# Patient Record
Sex: Male | Born: 1985 | Race: White | Hispanic: No | Marital: Single | Smoking: Current some day smoker
Health system: Southern US, Community
[De-identification: ages and names within clinical notes are randomized; demographics above are authoritative.]

## PROBLEM LIST (undated history)

## (undated) ENCOUNTER — Ambulatory Visit (HOSPITAL_COMMUNITY): Admission: EM | Payer: Self-pay | Source: Home / Self Care

## (undated) DIAGNOSIS — F1110 Opioid abuse, uncomplicated: Secondary | ICD-10-CM

## (undated) HISTORY — PX: OTHER SURGICAL HISTORY: SHX169

---
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
## 2005-12-30 ENCOUNTER — Emergency Department (HOSPITAL_COMMUNITY): Admission: EM | Admit: 2005-12-30 | Discharge: 2005-12-30 | Payer: Self-pay | Admitting: Emergency Medicine

## 2006-01-05 ENCOUNTER — Encounter: Admission: RE | Admit: 2006-01-05 | Discharge: 2006-01-05 | Payer: Self-pay | Admitting: Neurosurgery

## 2006-02-02 ENCOUNTER — Encounter: Admission: RE | Admit: 2006-02-02 | Discharge: 2006-02-02 | Payer: Self-pay | Admitting: Neurosurgery

## 2006-03-29 ENCOUNTER — Encounter: Admission: RE | Admit: 2006-03-29 | Discharge: 2006-03-29 | Payer: Self-pay | Admitting: Neurosurgery

## 2012-01-10 ENCOUNTER — Encounter (HOSPITAL_COMMUNITY): Payer: Self-pay | Admitting: *Deleted

## 2012-01-10 ENCOUNTER — Emergency Department (HOSPITAL_COMMUNITY)
Admission: EM | Admit: 2012-01-10 | Discharge: 2012-01-10 | Disposition: A | Discharge status: Home / Self Care | Payer: Self-pay | Attending: Emergency Medicine | Admitting: Emergency Medicine

## 2012-01-10 DIAGNOSIS — F1110 Opioid abuse, uncomplicated: Secondary | ICD-10-CM | POA: Insufficient documentation

## 2012-01-10 DIAGNOSIS — R110 Nausea: Secondary | ICD-10-CM | POA: Insufficient documentation

## 2012-01-10 DIAGNOSIS — F17200 Nicotine dependence, unspecified, uncomplicated: Secondary | ICD-10-CM | POA: Insufficient documentation

## 2012-01-10 HISTORY — DX: Opioid abuse, uncomplicated: F11.10

## 2012-01-10 LAB — COMPREHENSIVE METABOLIC PANEL
ALT: 19 U/L (ref 0–53)
AST: 13 U/L (ref 0–37)
CO2: 25 mEq/L (ref 19–32)
Calcium: 9.6 mg/dL (ref 8.4–10.5)
Chloride: 105 mEq/L (ref 96–112)
GFR calc Af Amer: >90 mL/min (ref >90)
GFR calc non Af Amer: >90 mL/min (ref >90)
Total Protein: 7.6 g/dL (ref 6.0–8.3)

## 2012-01-10 LAB — CBC
Hemoglobin: 14.4 g/dL (ref 13.0–17.0)
MCH: 30.8 pg (ref 26.0–34.0)
MCV: 87.4 fL (ref 78.0–100.0)
Platelets: 357 10*3/uL (ref 150–400)
WBC: 9.6 10*3/uL (ref 4.0–10.5)

## 2012-01-10 LAB — RAPID URINE DRUG SCREEN, HOSP PERFORMED
Amphetamines: NOT DETECTED
Barbiturates: NOT DETECTED
Cocaine: NOT DETECTED
Tetrahydrocannabinol: POSITIVE — AB

## 2012-01-10 LAB — DIFFERENTIAL
Basophils Absolute: 0 10*3/uL (ref 0.0–0.1)
Eosinophils Relative: 0 % (ref 0–5)
Lymphs Abs: 1.1 10*3/uL (ref 0.7–4.0)
Monocytes Absolute: 0.5 10*3/uL (ref 0.1–1.0)

## 2012-01-10 LAB — URINALYSIS, ROUTINE W REFLEX MICROSCOPIC
Hgb urine dipstick: NEGATIVE
Specific Gravity, Urine: 1.025 (ref 1.005–1.030)

## 2012-01-10 LAB — ETHANOL: Alcohol, Ethyl (B): <11 mg/dL (ref 0–11)

## 2012-01-10 MED ORDER — LORAZEPAM 1 MG PO TABS
1.0000 mg | ORAL_TABLET | Freq: Once | ORAL | Status: AC
  Administered 2012-01-10: 1 mg via ORAL
  Filled 2012-01-10: qty 1

## 2012-01-10 MED ORDER — ONDANSETRON 8 MG PO TBDP: 8.0000 mg | ORAL_TABLET | Freq: Three times a day (TID) | ORAL | Status: AC | PRN

## 2012-01-10 MED ORDER — ONDANSETRON 4 MG PO TBDP
4.0000 mg | ORAL_TABLET | Freq: Once | ORAL | Status: AC
  Administered 2012-01-10: 4 mg via ORAL
  Filled 2012-01-10: qty 1

## 2012-01-10 MED ORDER — LORAZEPAM 1 MG PO TABS: 1.0000 mg | ORAL_TABLET | Freq: Three times a day (TID) | ORAL | Status: AC | PRN

## 2012-01-10 NOTE — ED Notes (Signed)
Act team is in with pt at this time

## 2012-01-10 NOTE — ED Notes (Signed)
Pt is stable at d/c 

## 2012-01-10 NOTE — ED Notes (Signed)
Pt states he wants detox of pain medication.  Pt states he has tried other drugs in the past. Pt states he is having withdrawal. Pt states his last pain medication was on Sunday.

## 2012-01-10 NOTE — BH Assessment (Signed)
Assessment Note   Isaac Williams is an 26 y.o. male. Pt states he wants detox of pain medication. Pt specifies that he uses opiates including Heroin, Roxicodone, and Oxycodone. Pt reports Heroin use started "a few months ago". He reports using 3-4x's in the past month. His last use was 1 month ago. Patient also uses Oxycodone occasionally. His most used drug is Roxicodone in which he uses daily. He reports using 120 mg's or more daily x3 yrs. His last use was 2 days ago and he used approx. 90 mg's.  Pt states he has tried other drugs in the past. No alcohol use noted. Pt visible having severe withdrawals. He is unable to remain still during the assessment and pacing the room. Patient repeatedly stating, "Oh I feel so bad". Patient denies history of any type of mental health and/or substance abuse treatment. However, admits to seeing the psychiatrist Dr. Massie Maroon. Sts he has been prescribed Buspar, however; never got it filled. Pt mentions that his mother is prescribed the same medication and takes her medication "since it's the exact same". Patient denies SI, HI, and AVH's.   Writer discussed treatment options with patient. Writer strongly recommended patient to participate in in-pt treatment. Patient declined due to financial reasons. Explained to patient that their were programs available including in-pt that would provide services for those without funding or insurance. Patient then stated, "I just don't want in-pt treatment". Patient began asking if he could get out-pt referrals including referrals for Suboxene treatment. Writer provided paper referrals to patient ([facility], [facility], NA, various out-pt programs, independent threrapist/psychiatrist, etc.)  *Pt offered/refused in-pt detox. Out referrals given.   Axis I: Opioid Dependence Axis II: Deferred Axis III:  Past Medical History  Diagnosis Date  . Opiate abuse, continuous    Axis IV: economic problems, occupational problems, other  psychosocial or environmental problems, problems related to social environment and problems with access to health care services Axis V: 41-50 serious symptoms  Past Medical History:  Past Medical History  Diagnosis Date  . Opiate abuse, continuous     History reviewed. No pertinent past surgical history.  Family History: History reviewed. No pertinent family history.  Social History:  reports that he has been smoking.  He does not have any smokeless tobacco history on file. He reports that he uses illicit drugs (Heroin and Oxycodone). He reports that he does not drink alcohol.  Additional Social History:  Alcohol / Drug Use Pain Medications: Pt denies taking pain meds prescribed. Sts he buys opiates off the street. Prescriptions: Buspar (Sts he is not compliant with taking medications. Sts he never had filled. Takes his mothers medications b/c she has "the same thing". Over the Counter: Pt deneies History of alcohol / drug use?: Yes Longest period of sobriety (when/how long): 3-4 weeks Negative Consequences of Use: Financial;Personal relationships Withdrawal Symptoms: Agitation;Fever / Chills;Irritability;Nausea / Vomiting;Sweats;Tremors;Tingling (sts, "I feel weird". pt reports restless leg as well.) Substance #1 Name of Substance 1: Opiates (Heroin) 1 - Age of First Use: 25 1 - Amount (size/oz): pt doesn't know the exact amt. Sts it was a small amt. 1 - Frequency: 3x's in the past few months 1 - Duration: on-going for the past 3-4 months 1 - Last Use / Amount: 1 month ago sts "It has been a while" Substance #2 Name of Substance 2:  Roxy's 2 - Age of First Use: approx. 3 yrs ago 2 - Amount (size/oz): over a 120mg 's per day 2 - Frequency: daily  2 - Duration: on-going for the past 3 yrs every day 2 - Last Use / Amount: 2 days ago/appro.90mg 's Substance #3 Name of Substance 3: Oxycodone 3 - Age of First Use: approx. 2 yrs 3 - Amount (size/oz): "not much" 3 - Frequency: seldom  use 3 - Duration: on-going 3 - Last Use / Amount: yrs ago Allergies: No Known Allergies  Home Medications:  Medications Prior to Admission  Medication Dose Route Frequency Provider Last Rate Last Dose  . LORazepam (ATIVAN) tablet 1 mg  1 mg Oral Once [employer] Pickering, MD   1 mg at 01/10/12 1347  . ondansetron (ZOFRAN-ODT) disintegrating tablet 4 mg  4 mg Oral Once [employer] Pickering, MD   4 mg at 01/10/12 1347   No current outpatient prescriptions on file as of 01/10/2012.    OB/GYN Status:  No LMP for male patient.  General Assessment Data Location of Assessment: [facility] ACT Assessment:  (No) Living Arrangements:  (lives with mother) Can pt return to current living arrangement?: Yes Admission Status: Voluntary Is patient capable of signing voluntary admission?: Yes Transfer from: Home Referral Source: Self/Family/Friend  Education Status Is patient currently in school?: No  Risk to self Suicidal Ideation: No Suicidal Intent: No Is patient at risk for suicide?: No Suicidal Plan?: No Access to Means: No What has been your use of drugs/alcohol within the last 12 months?:  (Opiates-Heroin and Roxy's) Previous Attempts/Gestures: No How many times?:  (0) Other Self Harm Risks:  (none reported) Triggers for Past Attempts:  (n/a) Intentional Self Injurious Behavior: None Family Suicide History: No Recent stressful life event(s):  (pt denies) Persecutory voices/beliefs?: No Depression: No Depression Symptoms:  (pt denies symptoms) Substance abuse history and/or treatment for substance abuse?: Yes Suicide prevention information given to non-admitted patients: Not applicable  Risk to Others Homicidal Ideation: No Thoughts of Harm to Others: No Current Homicidal Intent: No Current Homicidal Plan: No Access to Homicidal Means: No Describe Access to Homicidal Means:  (n/a) Identified Victim:  (n/a) History of harm to others?: No Assessment of Violence: None  Noted Violent Behavior Description:  (n/a) Does patient have access to weapons?: No Criminal Charges Pending?: No Does patient have a court date: No  Psychosis Hallucinations: None noted Delusions: None noted  Mental Status Report Appear/Hygiene: Disheveled Eye Contact: Poor Motor Activity: Agitation;Restlessness;Tremors (pt sts that he is actively withdrawing) Speech: Logical/coherent Level of Consciousness: Irritable;Alert Mood: Anxious;Irritable;Preoccupied Affect: Anxious;Irritable;Preoccupied Anxiety Level: None Thought Processes: Coherent Judgement: Unimpaired Orientation: Person;Place;Time;Situation Obsessive Compulsive Thoughts/Behaviors: None  Cognitive Functioning Concentration: Decreased Memory: Recent Intact;Remote Intact IQ: Average Insight: Poor Impulse Control: Poor Appetite: Poor Weight Loss:  (unknown) Weight Gain:  (n/a) Sleep: Decreased Total Hours of Sleep:  (4-5 hrs per night')  Prior Inpatient Therapy Prior Inpatient Therapy: No Prior Therapy Dates:  (no prior therapy) Prior Therapy Facilty/Provider(s):  (n/a) Reason for Treatment:  (n/a)  Prior Outpatient Therapy Prior Outpatient Therapy: Yes Prior Therapy Dates:  (current) Prior Therapy Facilty/Provider(s):  [facility]) Reason for Treatment:  (depression, anxiety)  ADL Screening (condition at time of admission) Patient's cognitive ability adequate to safely complete daily activities?: Yes Patient able to express need for assistance with ADLs?: Yes Independently performs ADLs?: Yes Weakness of Legs: None Weakness of Arms/Hands: None  Home Assistive Devices/Equipment Home Assistive Devices/Equipment: None  Therapy Consults (therapy consults require a physician order) PT Evaluation Needed: No OT Evalulation Needed: No SLP Evaluation Needed: No Abuse/Neglect Assessment (Assessment to be complete while patient is alone) Physical  Abuse: Denies Verbal Abuse: Denies Sexual Abuse:  Denies Exploitation of patient/patient's resources: Denies Self-Neglect: Denies Values / Beliefs Cultural Requests During Hospitalization: None Spiritual Requests During Hospitalization: None Consults Spiritual Care Consult Needed: No Social Work Consult Needed: No   Nutrition Screen Diet: Regular Unintentional weight loss greater than 10lbs within the last month: No Dysphagia: No Home Tube Feeding or Total Parenteral Nutrition (TPN): No Patient appears severely malnourished: No Pregnant or Lactating: No Dietitian Consult Needed: No  Additional Information 1:1 In Past 12 Months?: No CIRT Risk: No Elopement Risk: No Does patient have medical clearance?: Yes     Disposition:  Disposition Disposition of Patient: Treatment offered and refused;Referred to;Outpatient treatment (in-pt detox from opiates) Type of outpatient treatment:  (various programs including: [facility], [facility], Support Groups,) Type of treatment offered and refused: In-patient Patient referred to: [facility];Outpatient clinic referral ([facility], [place], support groups, mobile c)  On Site Evaluation by:   Reviewed with Physician:     Melynda Ripple [facility] 01/10/2012 2:36 PM

## 2012-01-10 NOTE — ED Provider Notes (Signed)
History     CSN: [id number]  Arrival date & time 01/10/12  1226   First MD Initiated Contact with Patient 01/10/12 1325      Chief Complaint  Patient presents with  . Medical Clearance    (Consider location/radiation/quality/duration/timing/severity/associated sxs/prior treatment) The history is provided by the patient.   patient states she's requesting detox off of opiates. He states he takes 120 mg of Roxicet today. He snorts them. He also does some heroin occasionally. He will inject this into his left antecubital. He states it is mostly Roxicet. He is other medications the past. In the last 2 years his longest period of sobriety spent 2 weeks. He states he's not been in treatment before. He thinks he would like outpatient treatment now. He's had some nauseousness without vomiting. He states the worst part of the withdrawal for him as his restless legs. He last used 2 days ago. No fevers. No chest pain. No abdominal pain. He states it hurts all over. No suicidal or homicidal ideations  Past Medical History  Diagnosis Date  . Opiate abuse, continuous     History reviewed. No pertinent past surgical history.  History reviewed. No pertinent family history.  History  Substance Use Topics  . Smoking status: Current Some Day Smoker  . Smokeless tobacco: Not on file  . Alcohol Use: No      Review of Systems  Constitutional: Negative for activity change and appetite change.  HENT: Negative for neck stiffness.   Eyes: Negative for pain.  Respiratory: Negative for chest tightness and shortness of breath.   Cardiovascular: Negative for chest pain and leg swelling.  Gastrointestinal: Positive for nausea. Negative for vomiting, abdominal pain and diarrhea.  Genitourinary: Negative for flank pain.  Musculoskeletal: Positive for arthralgias. Negative for back pain.  Skin: Negative for rash.  Neurological: Negative for weakness, numbness and headaches.  Psychiatric/Behavioral:  Negative for behavioral problems.       Anxiety    Allergies  Review of patient's allergies indicates no known allergies.  Home Medications   Current Outpatient Rx  Name Route Sig Dispense Refill  . BUSPIRONE HCL 10 MG PO TABS Oral Take 20 mg by mouth 3 (three) times daily.     . CYCLOBENZAPRINE HCL 10 MG PO TABS Oral Take 10 mg by mouth 3 (three) times daily.    Marland Kitchen LORAZEPAM 1 MG PO TABS Oral Take 1 tablet (1 mg total) by mouth 3 (three) times daily as needed for anxiety. 15 tablet 0  . ONDANSETRON 8 MG PO TBDP Oral Take 1 tablet (8 mg total) by mouth every 8 (eight) hours as needed for nausea. 20 tablet 0    BP 119/77  Pulse 86  Temp(Src) 97.9 F (36.6 C) (Oral)  Resp 18  Ht 5\' 11"  (1.803 m)  Wt 165 lb (74.844 kg)  BMI 23.01 kg/m2  SpO2 94%  Physical Exam  Nursing note and vitals reviewed. Constitutional: He is oriented to person, place, and time. He appears well-developed and well-nourished.  HENT:  Head: Normocephalic and atraumatic.  Eyes: EOM are normal. Pupils are equal, round, and reactive to light.  Neck: Normal range of motion. Neck supple.  Cardiovascular: Normal rate, regular rhythm and normal heart sounds.   No murmur heard. Pulmonary/Chest: Effort normal and breath sounds normal.  Abdominal: Soft. Bowel sounds are normal. He exhibits no distension and no mass. There is no tenderness. There is no rebound and no guarding.  Musculoskeletal: Normal range of motion. He exhibits  no edema.  Neurological: He is alert and oriented to person, place, and time. No cranial nerve deficit.  Skin: Skin is warm and dry.       No abscess to left antecubital area.  Psychiatric: He has a normal mood and affect.    ED Course  Procedures (including critical care time)  Labs Reviewed  DIFFERENTIAL - Abnormal; Notable for the following:    Neutrophils Relative 82 (*)    Neutro Abs 7.9 (*)    All other components within normal limits  COMPREHENSIVE METABOLIC PANEL - Abnormal;  Notable for the following:    Glucose, Bld 100 (*)    All other components within normal limits  URINE RAPID DRUG SCREEN (HOSP PERFORMED) - Abnormal; Notable for the following:    Tetrahydrocannabinol POSITIVE (*)    All other components within normal limits  URINALYSIS, ROUTINE W REFLEX MICROSCOPIC - Abnormal; Notable for the following:    Ketones, ur TRACE (*)    All other components within normal limits  ETHANOL  CBC   No results found.   1. Opioid abuse       MDM  Patient is requesting detox off of opiates. Last use yesterday. Is requesting that he not get either Suboxone or methadone. He does not want inpatient treatment. He later told the social worker that he wanted Suboxone. He'll be discharged home with outpatient followup.        Juliet Rude. Rubin Payor, MD 01/10/12 [id number]

## 2012-01-10 NOTE — Discharge Instructions (Signed)
Opiate Dependence  The above names are all different names used for opiates. Opiates are any medication made from the poppy plant. It is a medication which produces a calming, sleepy effect. Because achieving this effect requires more and more of this drug to get the same result, opiates become addictive. A family history of addiction or an addictive personality increases the risk.  When drug use is interfering with normal living activities, it has become abuse. This includes problems with family, friends, and your job. Psychological dependence has developed when your mind tells you that the drug is needed. This emotional dependence is the craving for the "high" that some drugs cause. Emotional addicts always want this high instead of the way they are feeling when not using the drug. This is difficult to overcome.  This is usually followed by physical dependence that has developed when continuing increases of drug are required to get the same feeling or "high". This is known as addiction or chemical dependency.   SIGNS OF CHEMICAL DEPENDENCY  · Friends and family tell you there is a problem.   · Fighting when using drugs.   · Mood swings and insomnia.   · Forgetfulness.   · Not remembering what you do while using (blackouts).   · Feeling sick from using drugs but you continue using.   · Lie about use or amounts of drugs (chemicals) used.   · Need chemicals to get you going.   · Suffer in work performance or school because of drug use.   · Need drugs to relate to people or feel comfortable in social situations.   · Use drugs to forget problems.   · Difficulty with attention.   · Neglecting obligations.   If you answered "yes" to any or some of the above signs of chemical dependency, you may have a problem. The longer the use of drugs continues, the greater the problems will become. Do not experiment with drugs.   SIGNS AND SYMPTOMS OF PHYSICAL DEPENDENCE   Sudden stopping of the narcotic is uncomfortable when  tolerance has developed. Physical problems will develop. This is called withdrawal.   How bad the withdrawal is varies from person to person. Some of the smaller problems are:  · Tremors in the hands or shakes and jitters.   · A fast heart rate and rapid breathing.   · An increase in temperature.   · Anxiety and panic attacks with bad dreams.   · Muscle aches and pains.   You may have more serious problems. These can include:  · Feeling sick to your stomach or throwing up.   · Dehydration develops if you cannot keep fluids down.   · Tremors and chills or fever with sweating and anxiety.   · Hallucinations and cravings.   · Body aching with restlessness and insomnia.   · Seizures or convulsions.   These problems can last for months. These uncomfortable feeling can cause you to use drugs again just to feel better.  OTHER HEALTH RISKS OF NARCOTICS USE INCLUDE:  · The increased possibility of getting AIDS, hepatitis, other infectious or sexually transmitted diseases.   · Unplanned pregnancy and having a baby born addicted to narcotics. You then put your baby through painful withdrawal symptoms including: shaking, jerking, and crying in pain. Many babies die. Other babies have lifelong disabilities and learning problems.   TREATMENT   Effective treatment and management of narcotic addiction requires a multi-faceted, team approach that includes:  · Medications to   minimize the symptoms of narcotic withdrawal.   · Medications to reduce the need for continued narcotic use.   · Medical management of unrelated medical problems.   · Pain management.   · Social services.   · Psychological treatment.   · Behavioral therapies.   Stopping your dependence is hard but may save your life. If you continue using drugs, the only possible outcome is loss of self respect and esteem, violence, and eventually prison or death. To stop abuse, you must first realize you have a problem. You control your behavior. Once you realize this, commit to  quitting. Addiction is a disease. You need medical help to get well. Your caregiver can counsel you or refer you for counseling. The best way to do this is to seek out an organization for help. These include Alcoholics Anonymous, Narcotics Anonymous, or the National Council on Alcoholism and Drug Dependence.  HOW TO STAY CLEAN WHEN YOU HAVE QUIT USING  · Develop healthy activities.   · Form friendships with those who do not use drugs.   · Stay away from all drugs. Alcohol will lessen your ability to say no.   · Have ready excuses available about why you cannot use. If that is difficult, stay away from people who knew you used.   HOME CARE INSTRUCTIONS   · Both prescription medicines and over-the-counter medicines are used as part of the treatment. It is critical to follow the recommendations of your caregiver at home. Any additional over-the-counter medications or changes in the recommended treatment plan should be discussed with your caregiver first.   · It is important to keep fluids down. Juices, soda, Gatorade, or a mixture will help prevent dehydration.   · Be prepared for the emotional swings of quitting.   · Call your local emergency services if seizures (convulsions) occur or if you are unable to keep liquids down.   · Keep a written record of medications you take and times given.   Overcoming addictions takes years. Over time you will have a lessening of the craving for narcotics. Talk to your caregiver or a member of your support group if you need more help.   Addiction cannot be cured but it can be stopped. Treatment centers are listed in the yellow pages under: Cocaine, Narcotics, and Alcoholics Anonymous. Most hospitals and clinics can refer you to a specialized care center.  Document Released: 09/04/2007 Document Revised: 07/20/2011 Document Reviewed: 09/04/2007  [employer]® Patient Information ©2012 [employer].

## 2012-08-22 ENCOUNTER — Emergency Department (HOSPITAL_COMMUNITY)
Admission: EM | Admit: 2012-08-22 | Discharge: 2012-08-22 | Payer: Self-pay | Attending: Emergency Medicine | Admitting: Emergency Medicine

## 2012-08-22 ENCOUNTER — Encounter (HOSPITAL_COMMUNITY): Payer: Self-pay | Admitting: *Deleted

## 2012-08-22 DIAGNOSIS — F1120 Opioid dependence, uncomplicated: Secondary | ICD-10-CM | POA: Insufficient documentation

## 2012-08-22 DIAGNOSIS — F17200 Nicotine dependence, unspecified, uncomplicated: Secondary | ICD-10-CM | POA: Insufficient documentation

## 2012-08-22 LAB — BASIC METABOLIC PANEL
CO2: 27 mEq/L (ref 19–32)
Calcium: 9.8 mg/dL (ref 8.4–10.5)
Chloride: 102 mEq/L (ref 96–112)
Creatinine, Ser: 0.93 mg/dL (ref 0.50–1.35)
GFR calc Af Amer: >90 mL/min (ref >90)
GFR calc non Af Amer: >90 mL/min (ref >90)
Glucose, Bld: 90 mg/dL (ref 70–99)
Sodium: 139 mEq/L (ref 135–145)

## 2012-08-22 LAB — CBC WITH DIFFERENTIAL/PLATELET
Basophils Relative: 0 % (ref 0–1)
Eosinophils Relative: 1 % (ref 0–5)
HCT: 43.8 % (ref 39.0–52.0)
Hemoglobin: 15.2 g/dL (ref 13.0–17.0)
Lymphs Abs: 1 10*3/uL (ref 0.7–4.0)
MCV: 89.9 fL (ref 78.0–100.0)
Monocytes Absolute: 0.4 10*3/uL (ref 0.1–1.0)
Neutro Abs: 5.5 10*3/uL (ref 1.7–7.7)
RBC: 4.87 MIL/uL (ref 4.22–5.81)

## 2012-08-22 NOTE — ED Notes (Signed)
Report given to Gabe RN

## 2012-08-22 NOTE — BH Assessment (Signed)
Assessment Note   Isaac Williams is an 25 y.o. male that presents with his mother, requesting Heroin Detox.  Pt reports intermittent bouts of sobriety, but relapsed within the last two months and is now using at least 1 gram every other day.  Pt is currently experiencing sweats, chills, decreased sleep and appetite.  Pt reports being in the [facility] and seeing Dr. Evelene Croon up until two months ago.  Once he left the program, he began using again.  Pt denies any psychiatric history or any history of depression, SI, HI or any psychosis.  Pt is calm and cooperative and has support from his family.  Pt agrees to inpatient treatment with the option for residential treatment afterwards.    Axis I: Opioid Dependence Axis II: Deferred Axis III:  Past Medical History  Diagnosis Date  . Opiate abuse, continuous    Axis IV: occupational problems, problems related to social environment and problems with primary support group Axis V: 31-40 impairment in reality testing  Past Medical History:  Past Medical History  Diagnosis Date  . Opiate abuse, continuous     Past Surgical History  Procedure Date  . Compression fracture lower back     Family History: No family history on file.  Social History:  reports that he has been smoking.  He does not have any smokeless tobacco history on file. He reports that he uses illicit drugs (Heroin, Oxycodone, and IV). He reports that he does not drink alcohol.  Additional Social History:  Alcohol / Drug Use Pain Medications: None prescribed Prescriptions: None prescribed Over the Counter: No History of alcohol / drug use?: Yes Substance #1 Name of Substance 1: Heroin 1 - Age of First Use: 17 1 - Amount (size/oz): up to 1 gram 1 - Frequency: every other day 1 - Duration: two months  1 - Last Use / Amount: 2 days ago  CIWA: CIWA-Ar BP: 144/78 mmHg Pulse Rate: 91  COWS: Clinical Opiate Withdrawal Scale (COWS) Resting Pulse Rate: Pulse Rate 80 or  below Sweating: Subjective report of chills or flushing Restlessness: Able to sit still Pupil Size: Pupils pinned or normal size for room light Bone or Joint Aches: Not present Runny Nose or Tearing: Not present GI Upset: Stomach cramps Tremor: No tremor Yawning: No yawning Anxiety or Irritability: Patient reports increasing irritability or anxiousness Gooseflesh Skin: Piloerection of skin can be felt or hairs standing up on arms COWS Total Score: 6   Allergies:  Allergies  Allergen Reactions  . Capsaicin Other (See Comments)    Burning     Home Medications:  (Not in a hospital admission)  OB/GYN Status:  No LMP for male patient.  General Assessment Data Location of Assessment: [facility] ED Living Arrangements: Non-relatives/Friends Can pt return to current living arrangement?: Yes Admission Status: Voluntary Is patient capable of signing voluntary admission?: Yes Transfer from: Acute Hospital Referral Source: Self/Family/Friend  Education Status Is patient currently in school?: No Highest grade of school patient has completed: 2 years of college  Risk to self Suicidal Ideation: No Suicidal Intent: No Is patient at risk for suicide?: No Suicidal Plan?: No Access to Means: No What has been your use of drugs/alcohol within the last 12 months?: IV Heroin use Previous Attempts/Gestures: No How many times?: 0  Other Self Harm Risks: reckless and impulsive Triggers for Past Attempts: Unpredictable Intentional Self Injurious Behavior: Damaging Comment - Self Injurious Behavior: ongoing SA  Family Suicide History: No Recent stressful life event(s): Job  Loss;Turmoil (Comment) Persecutory voices/beliefs?: No Depression: No Substance abuse history and/or treatment for substance abuse?: Yes Suicide prevention information given to non-admitted patients: Not applicable  Risk to Others Homicidal Ideation: No Thoughts of Harm to Others: No Current Homicidal Intent: No Current  Homicidal Plan: No Access to Homicidal Means: No Identified Victim: none per pt History of harm to others?: No Assessment of Violence: None Noted Violent Behavior Description: calm and cooperative Does patient have access to weapons?: No Criminal Charges Pending?: No Does patient have a court date: No  Psychosis Hallucinations: None noted Delusions: None noted  Mental Status Report Appear/Hygiene: Other (Comment) Eye Contact: Good Motor Activity: Unremarkable Speech: Logical/coherent Level of Consciousness: Alert Mood: Helpless;Guilty Affect: Anxious Anxiety Level: Moderate Thought Processes: Relevant Judgement: Impaired Orientation: Person;Place;Time;Situation Obsessive Compulsive Thoughts/Behaviors: Moderate  Cognitive Functioning Concentration: Decreased Memory: Recent Intact;Remote Intact IQ: Average Insight: Fair Impulse Control: Poor Appetite: Fair Weight Loss: 10  Weight Gain: 0  Sleep: Decreased Total Hours of Sleep:  (2-3 at most) Vegetative Symptoms: None  ADLScreening [facility]) Patient's cognitive ability adequate to safely complete daily activities?: Yes Patient able to express need for assistance with ADLs?: Yes Independently performs ADLs?: Yes (appropriate for developmental age)  Abuse/Neglect [facility]) Physical Abuse: Denies Verbal Abuse: Denies Sexual Abuse: Denies  Prior Inpatient Therapy Prior Inpatient Therapy: No Prior Therapy Dates: n/a Prior Therapy Facilty/Provider(s): n/a Reason for Treatment: n/a  Prior Outpatient Therapy Prior Outpatient Therapy: Yes Prior Therapy Dates: 2012-2013 Prior Therapy Facilty/Provider(s): [facility] Reason for Treatment: SA  ADL Screening (condition at time of admission) Patient's cognitive ability adequate to safely complete daily activities?: Yes Patient able to express need for assistance with ADLs?: Yes Independently performs ADLs?: Yes (appropriate for developmental  age)       Abuse/Neglect Assessment (Assessment to be complete while patient is alone) Physical Abuse: Denies Verbal Abuse: Denies Sexual Abuse: Denies Exploitation of patient/patient's resources: Denies Self-Neglect: Denies Values / Beliefs Cultural Requests During Hospitalization: None Spiritual Requests During Hospitalization: None   Advance Directives (For Healthcare) Advance Directive: Patient does not have advance directive    Additional Information 1:1 In Past 12 Months?: No CIRT Risk: No Elopement Risk: No Does patient have medical clearance?: Yes     Disposition: Referred to [facility].  Screening packet completed. Disposition Disposition of Patient: Referred to Patient referred to: [facility]  On Site Evaluation by:   Reviewed with Physician:     Angelica Ran 08/22/2012 4:14 PM

## 2012-08-22 NOTE — ED Notes (Signed)
Patient placed in paper scrubs. Clothing inventoried and secured.  Silver colored necklace and cell phone given to patient's mother to take home.

## 2012-08-22 NOTE — ED Provider Notes (Signed)
History    This chart was scribed for Nelia Shi, MD, MD by Smitty Pluck. The patient was seen in room TR05C and the patient's care was started at 3:06PM.   CSN: [id number]  Arrival date & time 08/22/12  1409      Chief Complaint  Patient presents with  . Medical Clearance     The history is provided by the patient. No language interpreter was used.   Isaac Williams is a 26 y.o. male who presents to the Emergency Department for detox from IV heroin usage. Pt reports that he last used 2 days ago. Pt denies SI, HI and any other pain.    Past Medical History  Diagnosis Date  . Opiate abuse, continuous     Past Surgical History  Procedure Date  . Compression fracture lower back     No family history on file.  History  Substance Use Topics  . Smoking status: Current Some Day Smoker  . Smokeless tobacco: Not on file  . Alcohol Use: No      Review of Systems  All other systems reviewed and are negative.    Allergies  Capsaicin  Home Medications  No current outpatient prescriptions on file.  BP 144/78  Pulse 91  Temp 98.2 F (36.8 C) (Oral)  Resp 18  SpO2 100%  Physical Exam  Nursing note and vitals reviewed. Constitutional: He is oriented to person, place, and time. He appears well-developed. No distress.  HENT:  Head: Normocephalic and atraumatic.  Eyes: Pupils are equal, round, and reactive to light.  Neck: Normal range of motion.  Cardiovascular: Normal rate and intact distal pulses.   Pulmonary/Chest: No respiratory distress.  Abdominal: Normal appearance. He exhibits no distension.  Musculoskeletal: Normal range of motion.  Neurological: He is alert and oriented to person, place, and time. No cranial nerve deficit.  Skin: Skin is warm and dry. No rash noted.  Psychiatric: He has a normal mood and affect. His behavior is normal.    ED Course  Procedures (including critical care time) DIAGNOSTIC STUDIES: Oxygen Saturation is 100% on room  air, normal by my interpretation.    COORDINATION OF CARE:     Labs Reviewed  CBC WITH DIFFERENTIAL  BASIC METABOLIC PANEL  URINE RAPID DRUG SCREEN (HOSP PERFORMED)  SALICYLATE LEVEL  ACETAMINOPHEN LEVEL  ETHANOL   No results found.   1. Heroin addiction       MDM  We'll move to applied to see and consult the act team for possible placement    I personally performed the services described in this documentation, which was scribed in my presence. The recorded information has been reviewed and considered.       Nelia Shi, MD 08/22/12 2219

## 2012-08-22 NOTE — ED Notes (Signed)
Pt is here to detox from IV heroin.  No SI/HI.  Last use was 2 days ago

## 2021-03-17 ENCOUNTER — Encounter (HOSPITAL_COMMUNITY): Payer: Self-pay | Admitting: Emergency Medicine

## 2021-03-17 ENCOUNTER — Emergency Department (HOSPITAL_COMMUNITY): Payer: Worker's Compensation

## 2021-03-17 ENCOUNTER — Emergency Department (HOSPITAL_COMMUNITY)
Admission: EM | Admit: 2021-03-17 | Discharge: 2021-03-17 | Disposition: A | Discharge status: Home / Self Care | Payer: Worker's Compensation | Attending: Emergency Medicine | Admitting: Emergency Medicine

## 2021-03-17 DIAGNOSIS — F17200 Nicotine dependence, unspecified, uncomplicated: Secondary | ICD-10-CM | POA: Insufficient documentation

## 2021-03-17 DIAGNOSIS — M545 Low back pain, unspecified: Secondary | ICD-10-CM | POA: Diagnosis not present

## 2021-03-17 LAB — URINALYSIS, ROUTINE W REFLEX MICROSCOPIC
Glucose, UA: NEGATIVE mg/dL
Hgb urine dipstick: NEGATIVE
Ketones, ur: 5 mg/dL — AB
Leukocytes,Ua: NEGATIVE
Nitrite: NEGATIVE
Protein, ur: NEGATIVE mg/dL
Specific Gravity, Urine: 1.021 (ref 1.005–1.030)
pH: 5 (ref 5.0–8.0)

## 2021-03-17 MED ORDER — DIAZEPAM 5 MG PO TABS
5.0000 mg | ORAL_TABLET | Freq: Once | ORAL | Status: AC
  Administered 2021-03-17: 5 mg via ORAL
  Filled 2021-03-17: qty 1

## 2021-03-17 MED ORDER — PREDNISONE 20 MG PO TABS: 40.0000 mg | ORAL_TABLET | Freq: Every day | ORAL | 0 refills | Status: DC

## 2021-03-17 MED ORDER — KETOROLAC TROMETHAMINE 15 MG/ML IJ SOLN: 15.0000 mg | Freq: Once | INTRAMUSCULAR | Status: DC

## 2021-03-17 MED ORDER — CYCLOBENZAPRINE HCL 10 MG PO TABS: 10.0000 mg | ORAL_TABLET | Freq: Three times a day (TID) | ORAL | 0 refills | Status: AC | PRN

## 2021-03-17 MED ORDER — OXYCODONE HCL 5 MG PO TABS
5.0000 mg | ORAL_TABLET | Freq: Once | ORAL | Status: AC
  Administered 2021-03-17: 5 mg via ORAL
  Filled 2021-03-17: qty 1

## 2021-03-17 MED ORDER — CYCLOBENZAPRINE HCL 10 MG PO TABS: 10.0000 mg | ORAL_TABLET | Freq: Three times a day (TID) | ORAL | 0 refills | Status: DC | PRN

## 2021-03-17 MED ORDER — PREDNISONE 20 MG PO TABS: 40.0000 mg | ORAL_TABLET | Freq: Every day | ORAL | 0 refills | Status: AC

## 2021-03-17 MED ORDER — ACETAMINOPHEN 500 MG PO TABS
1000.0000 mg | ORAL_TABLET | Freq: Once | ORAL | Status: AC
  Administered 2021-03-17: 1000 mg via ORAL
  Filled 2021-03-17: qty 2

## 2021-03-17 MED ORDER — KETOROLAC TROMETHAMINE 15 MG/ML IJ SOLN
15.0000 mg | Freq: Once | INTRAMUSCULAR | Status: AC
  Administered 2021-03-17: 15 mg via INTRAMUSCULAR
  Filled 2021-03-17: qty 1

## 2021-03-17 NOTE — ED Notes (Signed)
Patient verbalizes understanding of discharge instructions. Opportunity for questioning and answers were provided. Armband removed by staff, pt discharged from ED and ambulated to lobby to return home with family.  

## 2021-03-17 NOTE — ED Triage Notes (Signed)
Pt here from home with c/o left side falnk pain , pt was doing some work yesterday when the pain started
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
## 2021-03-17 NOTE — Discharge Instructions (Signed)
You were provided a copy of your x-ray report.  I have prescribed a short course of muscle relaxers to help with your pain, please take 1 tablet up to 3 times a day for the next 5 days for muscle spasms.  Please be aware this medication can cause drowsiness, do not drink alcohol or drive while taking this medication.  You were also prescribed steroids on today's visit, please be aware this can cause insomnia, appetite changes, flushness.

## 2021-03-17 NOTE — ED Triage Notes (Signed)
Emergency Medicine Provider Triage Evaluation Note  Isaac Williams , a 35 y.o. male  was evaluated in triage.  Pt complains of left-sided back pain.  Thoracic and lumbar region.  Does not extend into the legs.  History of IVDU 2013 however not recently.  No bowel or bladder incontinence, saddle.  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  Pain worse with movement.  No chest pain, shortness of breath, lower extremity edema, no urinary complaints Review of Systems  Positive: Back pain, thoracic pain Negative: Chest pain, shortness of breath, abdominal pain, urinary  Physical Exam  BP (!) 142/98 (BP Location: Right Arm)   Pulse 72   Temp 98.1 F (36.7 C) (Oral)   Resp 18   SpO2 100%  Gen:   Awake, no distress   HEENT:  Atraumatic  Resp:  Normal effort  Cardiac:  Normal rate  Abd:   Nondistended, nontender  MSK:   Moves extremities without difficulty.  Tenderness to left thoracic, left lumbar paraspinal muscles with palpable spasm Neuro:  Speech clear, intact sensation  Medical Decision Making  Medically screening exam initiated at 7:56 AM.  Appropriate orders placed.  IANMICHAEL AMESCUA was informed that the remainder of the evaluation will be completed by another provider, this initial triage assessment does not replace that evaluation, and the importance of remaining in the ED until their evaluation is complete.  Clinical Impression  Left-sided back pain   Stable   Jayanna Kroeger A, PA-C 03/17/21 0802

## 2021-04-27 IMAGING — DX DG LUMBAR SPINE COMPLETE 4+V
5 series · 5 of 5 positions shown · non-contrast
Comparison: 12/30/2005

CLINICAL DATA: Left-sided back and flank pain beginning yesterday.

EXAM:
LUMBAR SPINE - COMPLETE 4+ VIEW

[l-spine ap]
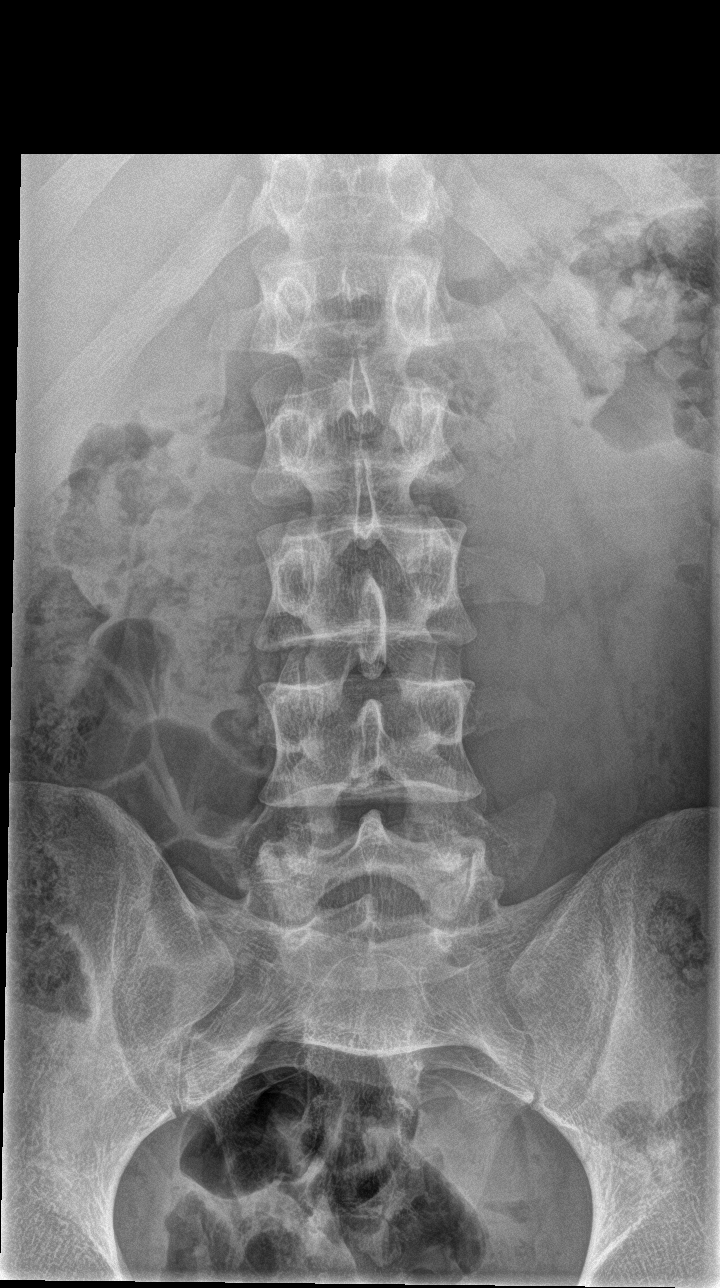

[l-spine obl (1 of 2)]
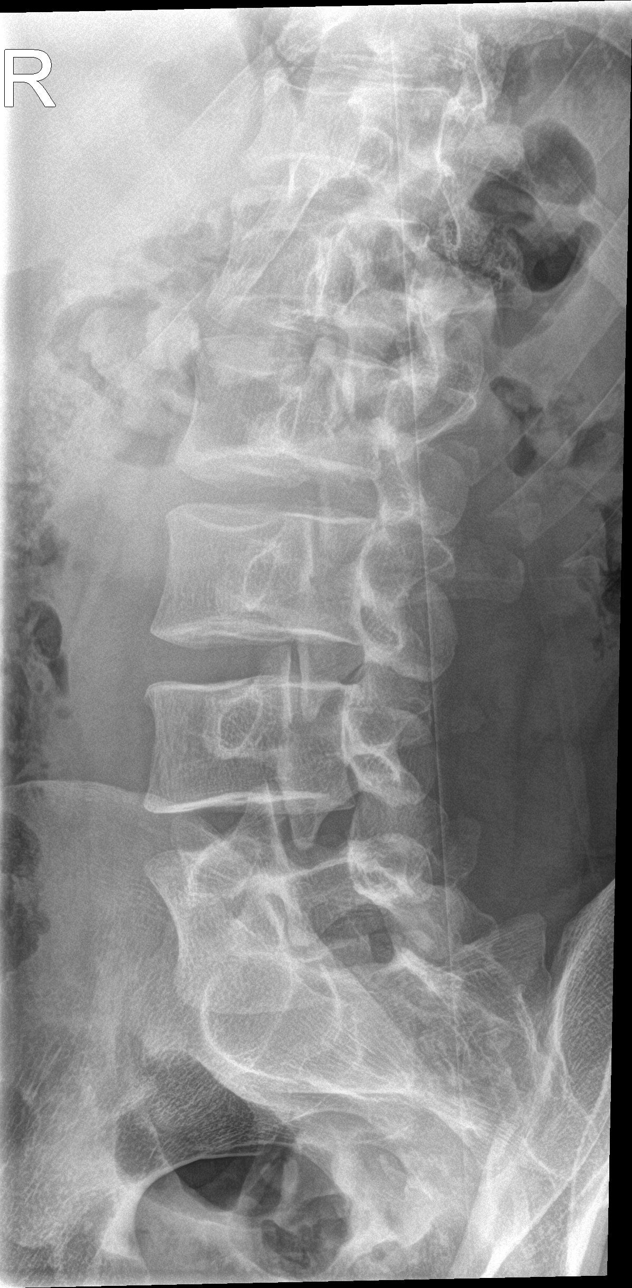

[l-spine obl (2 of 2)]
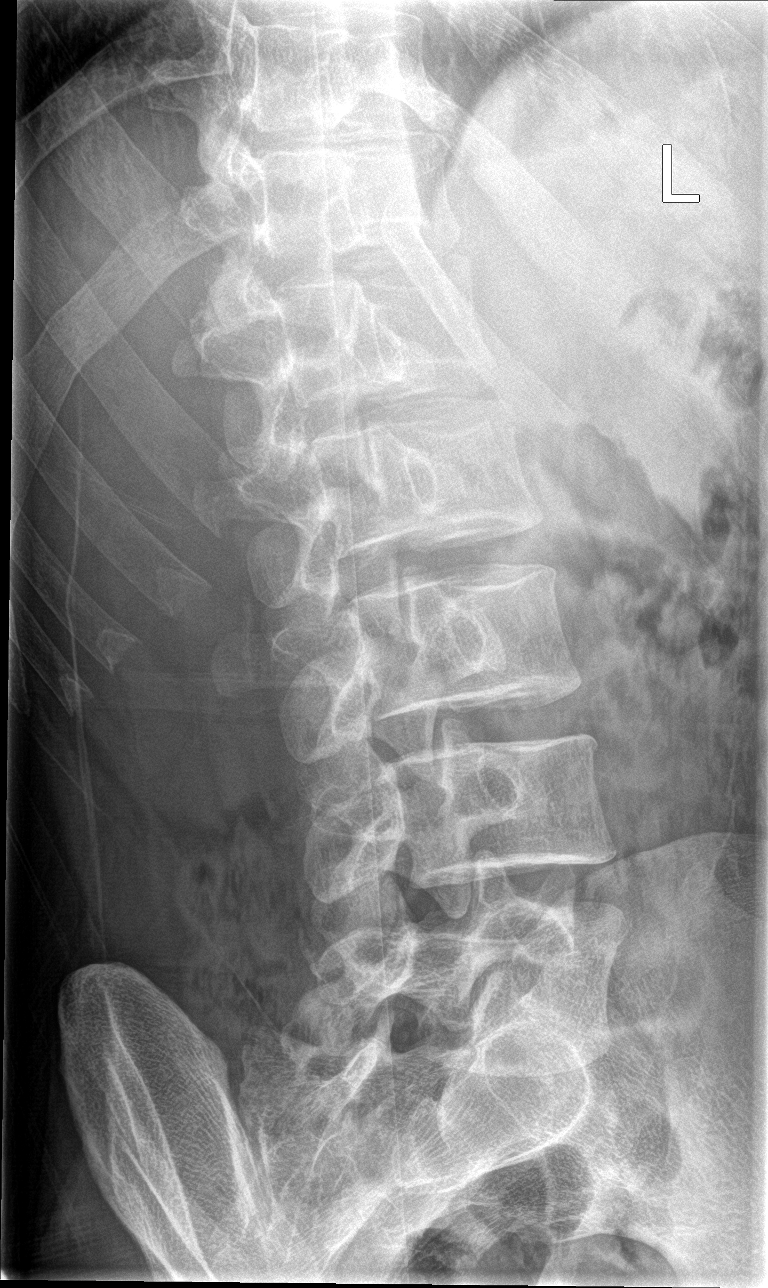

[l-spine lat]
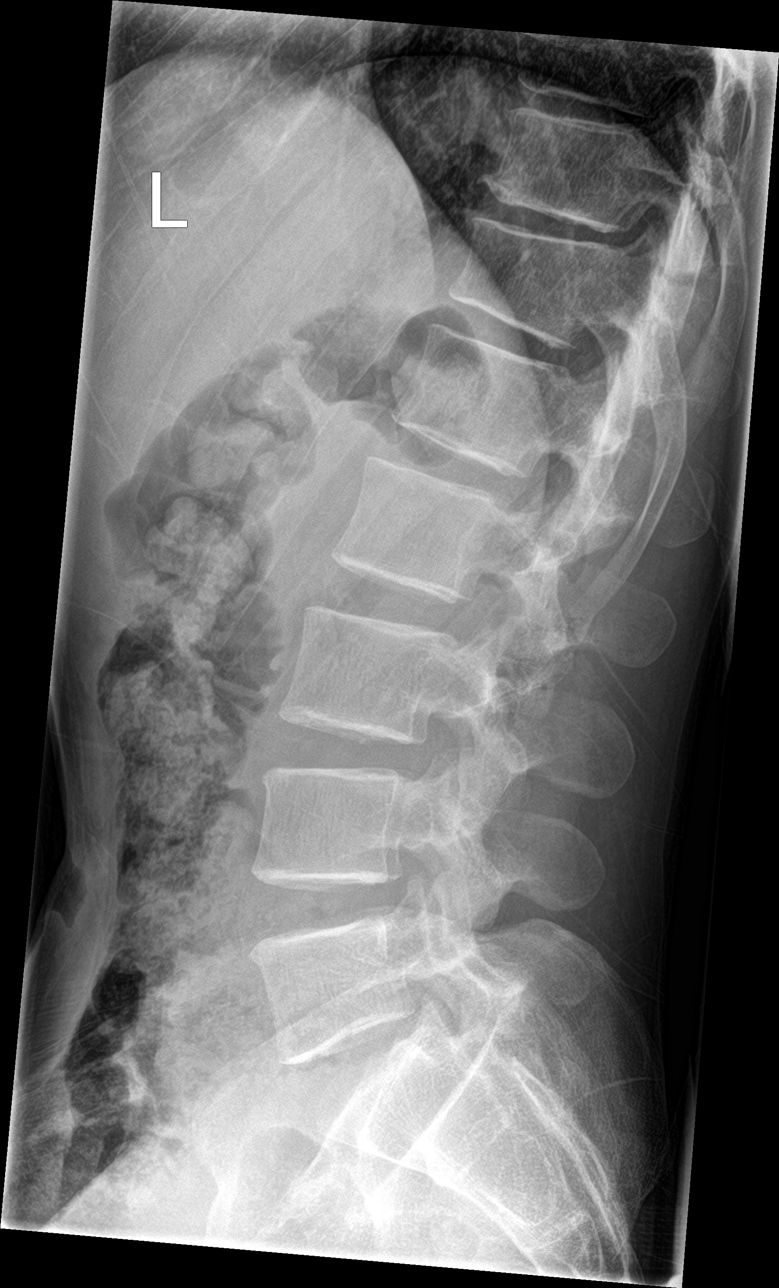

[l-spine spot]
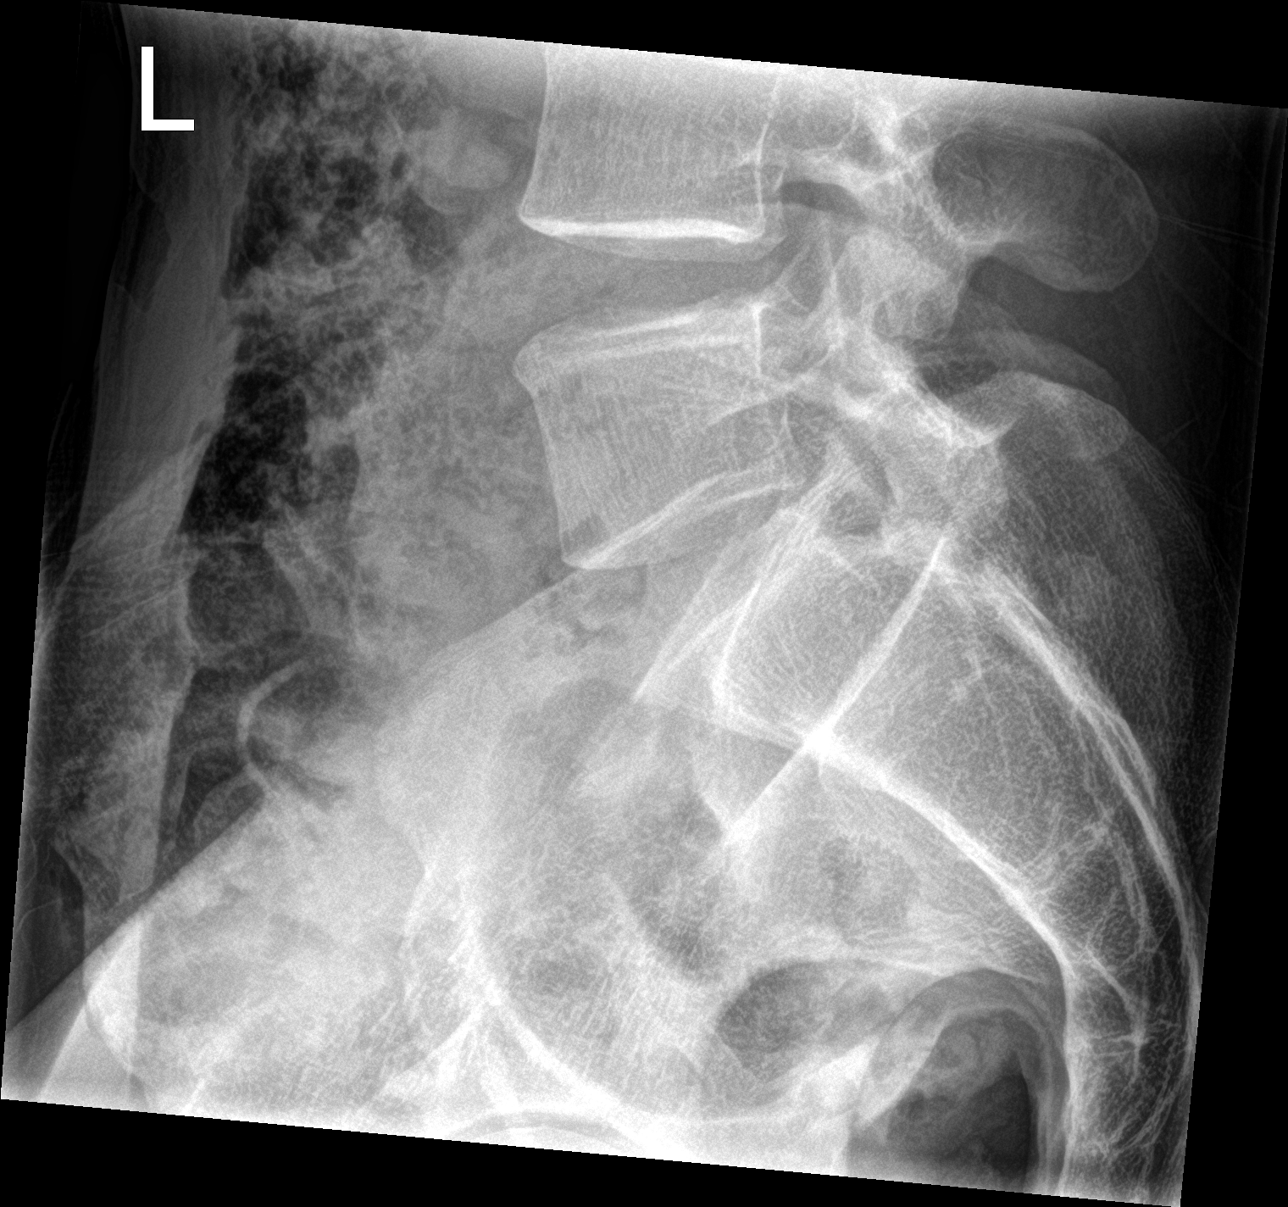

[5 of 5 positions shown; findings below may reference images not displayed]

FINDINGS: Five lumbar type vertebral bodies show normal alignment. Normal
lumbar disc spaces. No facet arthropathy or pars defect. Sacroiliac
joints appear normal. Old minor compression deformity at T12,
unchanged since 4660.
IMPRESSION: Normal appearance of the lumbar spine. Old minor compression
deformity at T12.

## 2022-10-18 DIAGNOSIS — M5442 Lumbago with sciatica, left side: Secondary | ICD-10-CM | POA: Diagnosis not present

## 2022-10-18 DIAGNOSIS — N50812 Left testicular pain: Secondary | ICD-10-CM | POA: Diagnosis not present

## 2022-10-18 DIAGNOSIS — Z0000 Encounter for general adult medical examination without abnormal findings: Secondary | ICD-10-CM | POA: Diagnosis not present

## 2022-10-18 DIAGNOSIS — Z23 Encounter for immunization: Secondary | ICD-10-CM | POA: Diagnosis not present

## 2022-10-18 DIAGNOSIS — Z13220 Encounter for screening for lipoid disorders: Secondary | ICD-10-CM | POA: Diagnosis not present

## 2022-11-08 ENCOUNTER — Other Ambulatory Visit: Payer: Self-pay | Admitting: Internal Medicine

## 2022-11-08 ENCOUNTER — Ambulatory Visit
Admission: RE | Admit: 2022-11-08 | Discharge: 2022-11-08 | Disposition: A | Discharge status: Home / Self Care | Payer: BC Managed Care – PPO | Source: Ambulatory Visit | Attending: Internal Medicine | Admitting: Internal Medicine

## 2022-11-08 DIAGNOSIS — R079 Chest pain, unspecified: Secondary | ICD-10-CM

## 2022-11-08 DIAGNOSIS — R0789 Other chest pain: Secondary | ICD-10-CM | POA: Diagnosis not present

## 2024-09-19 DIAGNOSIS — F17210 Nicotine dependence, cigarettes, uncomplicated: Secondary | ICD-10-CM | POA: Diagnosis not present

## 2024-09-19 DIAGNOSIS — Z01818 Encounter for other preprocedural examination: Secondary | ICD-10-CM | POA: Diagnosis not present

## 2024-09-19 DIAGNOSIS — Z6822 Body mass index (BMI) 22.0-22.9, adult: Secondary | ICD-10-CM | POA: Diagnosis not present
